# Patient Record
Sex: Female | Born: 1955
Health system: Southern US, Community
[De-identification: ages and names within clinical notes are randomized; demographics above are authoritative.]

## PROBLEM LIST (undated history)

## (undated) DIAGNOSIS — E119 Type 2 diabetes mellitus without complications: Secondary | ICD-10-CM

## (undated) DIAGNOSIS — E785 Hyperlipidemia, unspecified: Secondary | ICD-10-CM

## (undated) DIAGNOSIS — I10 Essential (primary) hypertension: Secondary | ICD-10-CM

## (undated) DIAGNOSIS — E079 Disorder of thyroid, unspecified: Secondary | ICD-10-CM

## (undated) HISTORY — PX: TUBAL LIGATION: SHX77

---

## 1998-06-11 ENCOUNTER — Emergency Department (HOSPITAL_COMMUNITY): Admission: EM | Admit: 1998-06-11 | Discharge: 1998-06-12 | Payer: Self-pay | Admitting: Emergency Medicine

## 1998-06-11 ENCOUNTER — Encounter: Payer: Self-pay | Admitting: Emergency Medicine

## 1998-06-12 ENCOUNTER — Encounter: Payer: Self-pay | Admitting: Emergency Medicine

## 1998-06-18 ENCOUNTER — Encounter: Admission: RE | Admit: 1998-06-18 | Discharge: 1998-07-05 | Payer: Self-pay | Admitting: Family Medicine

## 1998-08-02 ENCOUNTER — Other Ambulatory Visit: Admission: RE | Admit: 1998-08-02 | Discharge: 1998-08-02 | Payer: Self-pay | Admitting: Gynecology

## 2000-11-24 ENCOUNTER — Other Ambulatory Visit: Admission: RE | Admit: 2000-11-24 | Discharge: 2000-11-24 | Payer: Self-pay | Admitting: Obstetrics and Gynecology

## 2001-11-25 ENCOUNTER — Other Ambulatory Visit: Admission: RE | Admit: 2001-11-25 | Discharge: 2001-11-25 | Payer: Self-pay | Admitting: Obstetrics and Gynecology

## 2003-01-12 ENCOUNTER — Other Ambulatory Visit: Admission: RE | Admit: 2003-01-12 | Discharge: 2003-01-12 | Payer: Self-pay | Admitting: Obstetrics and Gynecology

## 2003-02-24 ENCOUNTER — Emergency Department (HOSPITAL_COMMUNITY): Admission: EM | Admit: 2003-02-24 | Discharge: 2003-02-24 | Payer: Self-pay | Admitting: Emergency Medicine

## 2005-01-17 ENCOUNTER — Other Ambulatory Visit: Admission: RE | Admit: 2005-01-17 | Discharge: 2005-01-17 | Payer: Self-pay | Admitting: *Deleted

## 2005-06-11 ENCOUNTER — Ambulatory Visit (HOSPITAL_COMMUNITY): Admission: RE | Admit: 2005-06-11 | Discharge: 2005-06-11 | Payer: Self-pay | Admitting: Family Medicine

## 2005-06-12 ENCOUNTER — Encounter: Admission: RE | Admit: 2005-06-12 | Discharge: 2005-07-08 | Payer: Self-pay | Admitting: Family Medicine

## 2006-07-08 ENCOUNTER — Other Ambulatory Visit: Admission: RE | Admit: 2006-07-08 | Discharge: 2006-07-08 | Payer: Self-pay | Admitting: Obstetrics and Gynecology

## 2007-06-15 ENCOUNTER — Ambulatory Visit (HOSPITAL_COMMUNITY): Admission: RE | Admit: 2007-06-15 | Discharge: 2007-06-15 | Payer: Self-pay | Admitting: Obstetrics and Gynecology

## 2007-08-30 ENCOUNTER — Encounter: Payer: Self-pay | Admitting: Obstetrics and Gynecology

## 2007-08-30 ENCOUNTER — Ambulatory Visit (HOSPITAL_BASED_OUTPATIENT_CLINIC_OR_DEPARTMENT_OTHER): Admission: RE | Admit: 2007-08-30 | Discharge: 2007-08-30 | Payer: Self-pay | Admitting: Obstetrics and Gynecology

## 2008-01-05 ENCOUNTER — Ambulatory Visit (HOSPITAL_COMMUNITY): Admission: RE | Admit: 2008-01-05 | Discharge: 2008-01-05 | Payer: Self-pay | Admitting: Family Medicine

## 2008-05-16 ENCOUNTER — Encounter: Admission: RE | Admit: 2008-05-16 | Discharge: 2008-05-16 | Payer: Self-pay | Admitting: Endocrinology

## 2008-08-23 ENCOUNTER — Encounter: Payer: Self-pay | Admitting: Internal Medicine

## 2008-08-23 ENCOUNTER — Encounter: Admission: RE | Admit: 2008-08-23 | Discharge: 2008-08-23 | Payer: Self-pay | Admitting: Family Medicine

## 2008-08-30 ENCOUNTER — Emergency Department (HOSPITAL_COMMUNITY): Admission: EM | Admit: 2008-08-30 | Discharge: 2008-08-31 | Payer: Self-pay | Admitting: Emergency Medicine

## 2008-10-04 ENCOUNTER — Other Ambulatory Visit: Admission: RE | Admit: 2008-10-04 | Discharge: 2008-10-04 | Payer: Self-pay | Admitting: Obstetrics and Gynecology

## 2008-10-05 ENCOUNTER — Ambulatory Visit: Payer: Self-pay | Admitting: Internal Medicine

## 2008-10-05 DIAGNOSIS — I10 Essential (primary) hypertension: Secondary | ICD-10-CM | POA: Insufficient documentation

## 2008-10-05 DIAGNOSIS — R0989 Other specified symptoms and signs involving the circulatory and respiratory systems: Secondary | ICD-10-CM

## 2008-10-05 DIAGNOSIS — R0609 Other forms of dyspnea: Secondary | ICD-10-CM | POA: Insufficient documentation

## 2008-10-05 DIAGNOSIS — E785 Hyperlipidemia, unspecified: Secondary | ICD-10-CM | POA: Insufficient documentation

## 2008-10-05 DIAGNOSIS — E119 Type 2 diabetes mellitus without complications: Secondary | ICD-10-CM | POA: Insufficient documentation

## 2008-11-20 ENCOUNTER — Encounter: Admission: RE | Admit: 2008-11-20 | Discharge: 2008-11-20 | Payer: Self-pay | Admitting: Endocrinology

## 2009-02-05 ENCOUNTER — Encounter: Admission: RE | Admit: 2009-02-05 | Discharge: 2009-02-05 | Payer: Self-pay | Admitting: Obstetrics and Gynecology

## 2009-02-13 ENCOUNTER — Encounter: Admission: RE | Admit: 2009-02-13 | Discharge: 2009-02-13 | Payer: Self-pay | Admitting: Obstetrics and Gynecology

## 2010-03-17 ENCOUNTER — Encounter: Payer: Self-pay | Admitting: Obstetrics and Gynecology

## 2010-03-18 ENCOUNTER — Encounter: Payer: Self-pay | Admitting: Obstetrics and Gynecology

## 2010-06-02 LAB — DIFFERENTIAL
Basophils Absolute: 0 10*3/uL (ref 0.0–0.1)
Eosinophils Absolute: 0 10*3/uL (ref 0.0–0.7)
Eosinophils Relative: 0 % (ref 0–5)
Lymphocytes Relative: 27 % (ref 12–46)
Monocytes Absolute: 0.2 10*3/uL (ref 0.1–1.0)

## 2010-06-02 LAB — CBC
HCT: 39.2 % (ref 36.0–46.0)
Hemoglobin: 13.2 g/dL (ref 12.0–15.0)
MCHC: 33.7 g/dL (ref 30.0–36.0)
MCV: 91.2 fL (ref 78.0–100.0)
Platelets: 339 10*3/uL (ref 150–400)
RDW: 13.2 % (ref 11.5–15.5)

## 2010-06-02 LAB — POCT I-STAT, CHEM 8
BUN: 17 mg/dL (ref 6–23)
Calcium, Ion: 1.2 mmol/L (ref 1.12–1.32)
Hemoglobin: 13.9 g/dL (ref 12.0–15.0)
Sodium: 142 mEq/L (ref 135–145)
TCO2: 29 mmol/L (ref 0–100)

## 2010-06-02 LAB — POCT CARDIAC MARKERS

## 2010-06-02 LAB — D-DIMER, QUANTITATIVE: D-Dimer, Quant: <0.22 ug/mL-FEU (ref 0.00–0.48)

## 2010-07-09 NOTE — Op Note (Signed)
[name], [name]                 ACCOUNT NO.:  [id number]   MEDICAL RECORD NO.:  [id number]          PATIENT TYPE:  AMB   LOCATION:  [facility]                         FACILITY:  [facility]   PHYSICIAN:  Cynthia P. Romine, M.D.DATE OF BIRTH:  January 09, 1956   DATE OF PROCEDURE:  08/30/2007  DATE OF DISCHARGE:                               OPERATIVE REPORT   PREOPERATIVE DIAGNOSIS:  Menorrhagia, with 4 cm prolapsed fibroid.   POSTOPERATIVE DIAGNOSIS:  Menorrhagia, with 4 cm prolapsed fibroid,  pathology pending.   PROCEDURE:  Hysteroscopic resection of fibroid, D&C.   SURGEON:  Cynthia P. Romine, M.D.   ANESTHESIA:  General by LMA.   ESTIMATED BLOOD LOSS:  50 mL.   COMPLICATIONS:  None.   SORBITOL DEFICIT:  145 mL.   PROCEDURE:  The patient was taken to the operating room and, after the  induction of adequate general anesthesia, was placed in the dorsal  lithotomy position and prepped and draped in the usual fashion.  The  bladder was drained with a red rubber catheter.  Posterior weighted and  anterior Sims retractor were placed.  Anterior lip of the cervix was  grasped with a single-tooth tenaculum.  A 4 cm fibroid could be seen  prolapsing through the cervix.  It was not possible to feel where the  attachment of the fibroid was to the uterus, but the cervix was very  dilated.  The fibroid was grasped with a tenaculum, and as far up into  the endocervical canal as was possible, the hysteroscope was placed, and  a single loop was used to remove the fibroid from its stalk.  Once the  fibroid was removed, the hysteroscope was inserted into the uterus, and  the fundus and the tubal ostia were noted.  There was also noted to be  about a 2 cm submucous myoma emanating from the patient's left fundus.  The rest of the endometrium appeared normal.  The scope was removed.  Sharp curettage was done and the specimen sent to pathology.  The scope  was then reinserted, and the 2 cm submucous myoma  was shaved with a  single loop cautery.  The fluid deficit start rising more rapidly than I  was comfortable with.  It had been at 140 mL and went up to 210 mL  within just a couple of minutes, and the myoma was bleeding rather  briskly, so was felt that it would be wise of [place] to stop the procedure  as soon as was safe.  The bleeding points on the fibroid that was  approximately half removed were cauterized, and with control the  bleeding the deficit stabilized, but because the surgeon felt like it  was possible that there was an area around the base of this fibroid that  looked like the myometrium was thin, I was concerned about perforation  if I continued, and because it was known that the patient had another  large subserous myoma and has already decided to have a hysterectomy  later, it was decided not to proceed with the hysteroscopic resection of  this second fibroid.  So, once bleeding was controlled, the scope was  removed.  The deficit ended up being about 145 mL.  There was some  sorbitol in the drape that had been not counted when the deficit had  been up to 210.  The patient was observed, and  the cervix was watched for bleeding for about 5 minutes after I took the  scope out, and the amount of bleeding was very small.  She was felt to  be stable.  Instruments were removed from the vagina, and the procedure  was terminated.  The patient tolerated the procedure well and went in  satisfactory condition to postanesthesia recovery.      Cynthia P. Romine, M.D.  Electronically Signed     CPR/MEDQ  D:  08/30/2007  T:  08/30/2007  Job:  [id number]

## 2010-11-21 LAB — POCT I-STAT 4, (NA,K, GLUC, HGB,HCT)
Glucose, Bld: 102 — ABNORMAL HIGH
HCT: 44
Operator id: 268271
Potassium: 3.4 — ABNORMAL LOW

## 2010-11-26 LAB — COMPREHENSIVE METABOLIC PANEL
ALT: 23
AST: 17
Albumin: 4.3
Calcium: 9.5
Creatinine, Ser: 0.63
GFR calc Af Amer: >60
Sodium: 139
Total Protein: 7

## 2010-11-26 LAB — LIPID PANEL
HDL: 83
Total CHOL/HDL Ratio: 3.4
Triglycerides: 96
VLDL: 19

## 2010-11-26 LAB — CBC
MCHC: 33.3
MCV: 92.3
Platelets: 318

## 2010-11-26 LAB — TSH: TSH: 0.607

## 2012-02-24 ENCOUNTER — Other Ambulatory Visit: Payer: Self-pay | Admitting: Obstetrics and Gynecology

## 2012-02-24 DIAGNOSIS — N63 Unspecified lump in unspecified breast: Secondary | ICD-10-CM

## 2012-02-26 ENCOUNTER — Other Ambulatory Visit: Payer: Self-pay | Admitting: Family Medicine

## 2012-02-26 DIAGNOSIS — N63 Unspecified lump in unspecified breast: Secondary | ICD-10-CM

## 2012-03-03 ENCOUNTER — Telehealth (HOSPITAL_COMMUNITY): Payer: Self-pay | Admitting: *Deleted

## 2012-03-03 NOTE — Telephone Encounter (Signed)
Telephoned home # and unable to leave message. Telephoned mobile # and voice mailbox not set up. Unable to schedule [facility] appointment.

## 2012-03-08 ENCOUNTER — Other Ambulatory Visit: Payer: Self-pay

## 2012-03-11 ENCOUNTER — Encounter (HOSPITAL_COMMUNITY): Payer: Self-pay | Admitting: *Deleted

## 2012-03-16 ENCOUNTER — Other Ambulatory Visit: Payer: Self-pay

## 2012-03-26 ENCOUNTER — Ambulatory Visit (HOSPITAL_COMMUNITY)
Admission: RE | Admit: 2012-03-26 | Discharge: 2012-03-26 | Disposition: A | Discharge status: Home / Self Care | Payer: Self-pay | Source: Ambulatory Visit | Attending: Obstetrics and Gynecology | Admitting: Obstetrics and Gynecology

## 2012-03-26 ENCOUNTER — Encounter (HOSPITAL_COMMUNITY): Payer: Self-pay

## 2012-03-26 VITALS — BP 136/82 | Temp 98.3°F | Ht 63.5 in | Wt 189.2 lb

## 2012-03-26 DIAGNOSIS — Z01419 Encounter for gynecological examination (general) (routine) without abnormal findings: Secondary | ICD-10-CM

## 2012-03-26 DIAGNOSIS — N644 Mastodynia: Secondary | ICD-10-CM | POA: Insufficient documentation

## 2012-03-26 HISTORY — DX: Disorder of thyroid, unspecified: E07.9

## 2012-03-26 HISTORY — DX: Essential (primary) hypertension: I10

## 2012-03-26 HISTORY — DX: Hyperlipidemia, unspecified: E78.5

## 2012-03-26 HISTORY — DX: Type 2 diabetes mellitus without complications: E11.9

## 2012-03-26 NOTE — Progress Notes (Signed)
Complaints of right lower and outer breast pain that comes and goes. Patient rates pain at a 4 out of 10 stating it increases at night.  Pap Smear:    Completed Pap smear today. Last Pap smear was 10/04/2008 and normal. Per patient no history of abnormal Pap smears. Pap smear result in EPIC.  Physical exam: Breasts Breasts symmetrical. No skin abnormalities bilateral breasts. No nipple retraction bilateral breasts. No nipple discharge bilateral breasts. No lymphadenopathy. No lumps palpated bilateral breasts. Patient complained of tenderness when palpated right lower breast. Discussed with patient possible causes of breast pain. Referred patient to the [facility] for diagnostic mammogram and possible right breast ultrasound. Appointment scheduled for Monday, March 29, 2012 at 1440.           Pelvic/Bimanual   Ext Genitalia No lesions, no swelling and no discharge observed on external genitalia.         Vagina Vagina pink and normal texture. No lesions or discharge observed in vagina.          Cervix Cervix is present. Cervix pink and of normal texture. Cervical polyp observed at cervical os and bled easy on exam.         Uterus Uterus is present and palpable. Uterus in normal position and normal size.       Adnexae Bilateral ovaries present and palpable. No tenderness on palpation.        Rectovaginal No rectal exam completed today since patient had no rectal complaints. No skin abnormalities observed on exam.

## 2012-03-26 NOTE — Patient Instructions (Addendum)
Taught patient how to perform BSE. Let her know [facility] will cover Pap smears every 3 years unless has a history of abnormal Pap smears. Recommended patient to have cervical polyp removed gave her resources of where she can have it removed. Referred patient to the [facility] for diagnostic mammogram and possible right breast ultrasound. Appointment scheduled for Monday, March 29, 2012 at 1440. Patient aware of appointment and will be there. Let patient know will follow up with her within the next couple weeks with results by letter or phone. Patient verbalized understanding.

## 2012-03-29 ENCOUNTER — Ambulatory Visit
Admission: RE | Admit: 2012-03-29 | Discharge: 2012-03-29 | Disposition: A | Discharge status: Home / Self Care | Payer: No Typology Code available for payment source | Source: Ambulatory Visit | Attending: Family Medicine | Admitting: Family Medicine

## 2012-03-29 ENCOUNTER — Other Ambulatory Visit (HOSPITAL_COMMUNITY)
Admission: RE | Admit: 2012-03-29 | Discharge: 2012-03-29 | Disposition: A | Discharge status: Home / Self Care | Payer: Self-pay | Source: Ambulatory Visit | Attending: Obstetrics and Gynecology | Admitting: Obstetrics and Gynecology

## 2012-03-29 DIAGNOSIS — N63 Unspecified lump in unspecified breast: Secondary | ICD-10-CM

## 2012-03-29 DIAGNOSIS — N841 Polyp of cervix uteri: Secondary | ICD-10-CM | POA: Insufficient documentation

## 2013-06-21 ENCOUNTER — Other Ambulatory Visit: Payer: Self-pay | Admitting: Family Medicine

## 2013-06-21 DIAGNOSIS — E049 Nontoxic goiter, unspecified: Secondary | ICD-10-CM

## 2013-06-23 ENCOUNTER — Ambulatory Visit
Admission: RE | Admit: 2013-06-23 | Discharge: 2013-06-23 | Disposition: A | Discharge status: Home / Self Care | Payer: BC Managed Care – PPO | Source: Ambulatory Visit | Attending: Family Medicine | Admitting: Family Medicine

## 2013-06-23 DIAGNOSIS — E049 Nontoxic goiter, unspecified: Secondary | ICD-10-CM

## 2014-03-27 ENCOUNTER — Other Ambulatory Visit: Payer: Self-pay | Admitting: Family Medicine

## 2014-03-27 DIAGNOSIS — E039 Hypothyroidism, unspecified: Secondary | ICD-10-CM

## 2014-04-04 ENCOUNTER — Ambulatory Visit
Admission: RE | Admit: 2014-04-04 | Discharge: 2014-04-04 | Disposition: A | Discharge status: Home / Self Care | Payer: BLUE CROSS/BLUE SHIELD | Source: Ambulatory Visit | Attending: Family Medicine | Admitting: Family Medicine

## 2014-04-04 DIAGNOSIS — E039 Hypothyroidism, unspecified: Secondary | ICD-10-CM

## 2017-01-01 DIAGNOSIS — Z23 Encounter for immunization: Secondary | ICD-10-CM | POA: Diagnosis not present

## 2017-01-08 DIAGNOSIS — E7800 Pure hypercholesterolemia, unspecified: Secondary | ICD-10-CM | POA: Diagnosis not present

## 2017-01-08 DIAGNOSIS — E1165 Type 2 diabetes mellitus with hyperglycemia: Secondary | ICD-10-CM | POA: Diagnosis not present

## 2017-01-08 DIAGNOSIS — E119 Type 2 diabetes mellitus without complications: Secondary | ICD-10-CM | POA: Diagnosis not present

## 2017-01-08 DIAGNOSIS — I119 Hypertensive heart disease without heart failure: Secondary | ICD-10-CM | POA: Diagnosis not present

## 2017-06-11 DIAGNOSIS — E039 Hypothyroidism, unspecified: Secondary | ICD-10-CM | POA: Diagnosis not present

## 2017-06-11 DIAGNOSIS — I119 Hypertensive heart disease without heart failure: Secondary | ICD-10-CM | POA: Diagnosis not present

## 2017-06-11 DIAGNOSIS — D509 Iron deficiency anemia, unspecified: Secondary | ICD-10-CM | POA: Diagnosis not present

## 2017-06-11 DIAGNOSIS — E7800 Pure hypercholesterolemia, unspecified: Secondary | ICD-10-CM | POA: Diagnosis not present

## 2017-06-11 DIAGNOSIS — E1165 Type 2 diabetes mellitus with hyperglycemia: Secondary | ICD-10-CM | POA: Diagnosis not present

## 2017-06-11 DIAGNOSIS — Z79899 Other long term (current) drug therapy: Secondary | ICD-10-CM | POA: Diagnosis not present

## 2017-10-02 DIAGNOSIS — H25013 Cortical age-related cataract, bilateral: Secondary | ICD-10-CM | POA: Diagnosis not present

## 2017-10-02 DIAGNOSIS — H40013 Open angle with borderline findings, low risk, bilateral: Secondary | ICD-10-CM | POA: Diagnosis not present

## 2017-10-02 DIAGNOSIS — H2513 Age-related nuclear cataract, bilateral: Secondary | ICD-10-CM | POA: Diagnosis not present

## 2017-10-12 NOTE — Progress Notes (Signed)
[facility] Note  10/13/2017     CHIEF COMPLAINT Patient presents for Retina Evaluation and Diabetic Eye Exam   HISTORY OF PRESENT ILLNESS: [name] is a 62 y.o. female who presents to the clinic today for:   HPI    Retina Evaluation    In both eyes.  This started 3 weeks ago.  Associated Symptoms Negative for Flashes, Blind Spot, Scalp Tenderness, Photophobia, Floaters, Pain, Glare, Jaw Claudication, Weight Loss, Distortion, Redness, Trauma, Shoulder/Hip pain, Fever and Fatigue.  Context:  distance vision, mid-range vision and near vision.  Treatments tried include eye drops and surgery.  Response to treatment was mild improvement.  I, the attending physician,  performed the HPI with the patient and updated documentation appropriately.          Diabetic Eye Exam    Vision is stable.  Associated Symptoms Negative for Flashes, Blind Spot, Photophobia, Scalp Tenderness, Fever, Floaters, Pain, Glare, Jaw Claudication, Weight Loss, Distortion, Redness, Trauma and Shoulder/Hip pain.  Diabetes characteristics include Type 2.  This started 10 years ago.  Blood sugar level is controlled.  Last Blood Glucose 110.  I, the attending physician,  performed the HPI with the patient and updated documentation appropriately.          Comments    Referral of Dr. Shirley Muscat for retina eval. Patient states she has occasional ache OS since  disinfect splashed in her eye three weeks ago. Denies flashes, floaters and blurry vision.Pt had laser OU 2018 with visual improvement . Pt use Refresh Opti gtt's and Ketofen gtt's. PRN. Pt is DM2 x 10 yrs Bs are usually WINL per patient, Bs 110 (10/12/17) A1C (results unknown) reports WINL .Patient is on Glipizide.        Last edited by Bernarda Caffey, MD on 10/13/2017  2:50 PM. (History)      Referring physician: Calton Dach, MD [id number], [place] [address]  HISTORICAL INFORMATION:   Selected notes  from the MEDICAL RECORD NUMBER Referred by Dr. Thurston Hole for retinal evaluation LEE: 08.12.19 (S. Bernstorf) [BCVA: OD: 20/25-- OS: 20/30--] Ocular Hx-open angle glaucoma OU, cataract OU, blepharitis OU -- upper / lower -- arcus, conjunctivochalasis, pinguecula PMH-DM (taking metformin), HTN, high cholesterol    CURRENT MEDICATIONS: No current outpatient medications on file. (Ophthalmic Drugs)   No current facility-administered medications for this visit.  (Ophthalmic Drugs)   Current Outpatient Medications (Other)  Medication Sig  . furosemide (LASIX) 20 MG tablet Take 20 mg by mouth 2 (two) times daily.  Marland Kitchen glipiZIDE (GLUCOTROL) 5 MG tablet Take 5 mg by mouth 2 (two) times daily before a meal.  . levothyroxine (SYNTHROID, LEVOTHROID) 50 MCG tablet Take 50 mcg by mouth daily.  Marland Kitchen lisinopril (PRINIVIL,ZESTRIL) 20 MG tablet Take 20 mg by mouth daily.  . pravastatin (PRAVACHOL) 10 MG tablet Take 10 mg by mouth daily.   No current facility-administered medications for this visit.  (Other)      REVIEW OF SYSTEMS: ROS    Positive for: Endocrine, Eyes   Negative for: Constitutional, Gastrointestinal, Neurological, Skin, Genitourinary, Musculoskeletal, HENT, Cardiovascular, Respiratory, Psychiatric, Allergic/Imm, Heme/Lymph   Last edited by Zenovia Jordan, LPN on 3/41/9622  2:97 PM. (History)       ALLERGIES No Known Allergies  PAST MEDICAL HISTORY Past Medical History:  Diagnosis Date  . Diabetes mellitus without complication ([place])   . Hyperlipidemia   . Hypertension   . Thyroid disease    Past Surgical  History:  Procedure Laterality Date  . TUBAL LIGATION      FAMILY HISTORY Family History  Problem Relation Age of Onset  . Diabetes Mother   . Hypertension Mother   . Diabetes Father   . Hypertension Father   . Diabetes Sister   . Diabetes Brother     SOCIAL HISTORY Social History   Tobacco Use  . Smoking status: Never Smoker  . Smokeless tobacco: Never  Used  Substance Use Topics  . Alcohol use: No  . Drug use: No         OPHTHALMIC EXAM:  Base Eye Exam    Visual Acuity (Snellen - Linear)      Right Left   Dist cc 20/20 20/25   Dist ph cc  NI   Correction:  Glasses       Tonometry (Tonopen, 1:37 PM)      Right Left   Pressure 14 17       Pupils      Dark Light Shape React APD   Right 4 3 Round Brisk None   Left 4 3 Round Brisk None       Visual Fields (Counting fingers)      Left Right    Full Full       Extraocular Movement      Right Left    Full, Ortho Full, Ortho       Neuro/Psych    Oriented x3:  Yes   Mood/Affect:  Normal       Dilation    Both eyes:  1.0% Mydriacyl, 2.5% Phenylephrine @ 1:37 PM        Slit Lamp and Fundus Exam    Slit Lamp Exam      Right Left   Lids/Lashes Meibomian gland dysfunction Meibomian gland dysfunction   Conjunctiva/Sclera Mild Melanosis, Trace Injection Mild Melanosis, Trace Injection, Nasal Pinguecula   Cornea Arcus, Debris in tear film, trace Punctate epithelial erosions Arcus, Debris in tear film, trace Punctate epithelial erosions, Decreased TBUT   Anterior Chamber Deep, Narrow angle Deep, Narrow angle   Iris Round and dilated, Patent peripheral iridectomy at 0830 Round and dilated, Patent peripheral iridectomy at 0330   Lens 2+ Nuclear sclerosis, 2+ Cortical cataract 2+ Nuclear sclerosis, 2+ Cortical cataract   Vitreous Vitreous syneresis Vitreous syneresis       Fundus Exam      Right Left   Disc Temporal Peripapillary atrophy, Pink and Sharp, Tilted disc Pink and Sharp   C/D Ratio 0.5 0.5   Macula Good foveal reflex, Retinal pigment epithelial mottling, No heme or edema Good foveal reflex, RPE irregularity temporal fovea, trace cystic changes, Retinal pigment epithelial mottling, No heme or edema   Vessels Vascular attenuation Vascular attenuation   Periphery Attached Attached        Refraction    Manifest Refraction (Auto)      Sphere Cylinder Axis  Dist VA   Right +0.25 +0.50 155 20/20   Left +0.00 +0.50 028 20/20-2          IMAGING AND PROCEDURES  Imaging and Procedures for _0 @  OCT, Retina - OU - Both Eyes       Right Eye Quality was good. Central Foveal Thickness: 244. Progression has no prior data. Findings include normal foveal contour, no SRF, intraretinal fluid.   Left Eye Quality was good. Central Foveal Thickness: 252. Progression has no prior data. Findings include normal foveal contour, no SRF, intraretinal fluid, outer retinal atrophy.  Notes *Images captured and stored on drive  Diagnosis / Impression:  OD: mild cystic changes temporal fovea OS: mild cystic changes temporal fovea with mild ellipsoid disruption underlying cystic changes  Clinical management:  See below  Abbreviations: NFP - Normal foveal profile. CME - cystoid macular edema. PED - pigment epithelial detachment. IRF - intraretinal fluid. SRF - subretinal fluid. EZ - ellipsoid zone. ERM - epiretinal membrane. ORA - outer retinal atrophy. ORT - outer retinal tubulation. SRHM - subretinal hyper-reflective material         Fluorescein Angiography Optos (Transit OS)       Right Eye   Progression has no prior data. Early phase findings include normal observations (Low signal). Mid/Late phase findings include normal observations (Low signal).   Left Eye   Progression has no prior data. Early phase findings include normal observations (Low signal). Mid/Late phase findings include normal observations (Low signal).   Notes *Images captured and stored on drive;   Impression: normal study OU -- no leakage / CME corresponding to cystic changes on OCT; no MA DM2 without retinopathy                  ASSESSMENT/PLAN:    ICD-10-CM   1. Retinal edema H35.81 OCT, Retina - OU - Both Eyes  2. Diabetes mellitus type 2 without retinopathy ([place]) E11.9 Fluorescein Angiography Optos (Transit OS)  3. Essential hypertension I10   4.  Hypertensive retinopathy of both eyes H35.033 Fluorescein Angiography Optos (Transit OS)  5. Combined forms of age-related cataract of both eyes H25.813   6. Anatomical narrow angle H40.039     1. Retinal Edema OU, OS>OD - Mild cystic changes, temporal fovea OU (OS > OD) - FA today 8.20.19 with low signal but no obvious leakage or CME corresponding to cystic changes - BCVA 20/20 OU and patient asymptomatic -- minimal visual significance - differential includes mild foveoschisis, mac tel 2, DME but FA suggests non-vascular etiology - F/U 4 months  2. Diabetes mellitus, type 2 without retinopathy - The incidence, risk factors for progression, natural history and treatment options for diabetic retinopathy  were discussed with patient.   - The need for close monitoring of blood glucose, blood pressure, and serum lipids, avoiding cigarette or any type of tobacco, and the need for long term follow up was also discussed with patient. - f/u in 1 year, sooner prn  3,4. Hypertensive retinopathy OU - discussed importance of tight BP control - monitor  5. Combined form age related cataract OU-  - The symptoms of cataract, surgical options, and treatments and risks were discussed with patient. - discussed diagnosis and progression - not yet visually significant - monitor for now  6. Narrow angles OU-  - S/P PI OU by Dr. Katy Fitch (2018) - IOP good today - not on any gtts - monitor   Ophthalmic Meds Ordered this visit:  No orders of the defined types were placed in this encounter.      Return in about 4 months (around 02/12/2018) for F/U cystic changes OU, DFE, OCT.  There are no Patient Instructions on file for this visit.   Explained the diagnoses, plan, and follow up with the patient and they expressed understanding.  Patient expressed understanding of the importance of proper follow up care.  This document serves as a record of services personally performed by Gardiner Sleeper, MD,  PhD. It was created on their behalf by Ernest Mallick, OA, an ophthalmic assistant. The creation of this  record is the provider's dictation and/or activities during the visit.    Electronically signed by: Ernest Mallick, OA  08.19.2019 3:04 PM     Gardiner Sleeper, M.D., Ph.D. Diseases & Surgery of the Retina and Vitreous [facility] [place] [facility]   I have reviewed the above documentation for accuracy and completeness, and I agree with the above. Gardiner Sleeper, M.D., Ph.D. 10/13/17 3:04 PM     Abbreviations: M myopia (nearsighted); A astigmatism; H hyperopia (farsighted); P presbyopia; Mrx spectacle prescription;  CTL contact lenses; OD right eye; OS left eye; OU both eyes  XT exotropia; ET esotropia; PEK punctate epithelial keratitis; PEE punctate epithelial erosions; DES dry eye syndrome; MGD meibomian gland dysfunction; ATs artificial tears; PFAT's preservative free artificial tears; [place] nuclear sclerotic cataract; PSC posterior subcapsular cataract; ERM epi-retinal membrane; PVD posterior vitreous detachment; RD retinal detachment; DM diabetes mellitus; DR diabetic retinopathy; NPDR non-proliferative diabetic retinopathy; PDR proliferative diabetic retinopathy; CSME clinically significant macular edema; DME diabetic macular edema; dbh dot blot hemorrhages; CWS cotton wool spot; POAG primary open angle glaucoma; C/D cup-to-disc ratio; HVF humphrey visual field; GVF goldmann visual field; OCT optical coherence tomography; IOP intraocular pressure; BRVO Branch retinal vein occlusion; CRVO central retinal vein occlusion; CRAO central retinal artery occlusion; BRAO branch retinal artery occlusion; RT retinal tear; SB scleral buckle; PPV pars plana vitrectomy; VH Vitreous hemorrhage; PRP panretinal laser photocoagulation; IVK intravitreal kenalog; VMT vitreomacular traction; MH Macular hole;  NVD neovascularization of the disc; NVE neovascularization elsewhere; AREDS age related eye disease  study; ARMD age related macular degeneration; POAG primary open angle glaucoma; EBMD epithelial/anterior basement membrane dystrophy; ACIOL anterior chamber intraocular lens; IOL intraocular lens; PCIOL posterior chamber intraocular lens; Phaco/IOL phacoemulsification with intraocular lens placement; [place] photorefractive keratectomy; LASIK laser assisted in situ keratomileusis; HTN hypertension; DM diabetes mellitus; COPD chronic obstructive pulmonary disease

## 2017-10-13 ENCOUNTER — Encounter (INDEPENDENT_AMBULATORY_CARE_PROVIDER_SITE_OTHER): Payer: Self-pay | Admitting: Ophthalmology

## 2017-10-13 ENCOUNTER — Ambulatory Visit (INDEPENDENT_AMBULATORY_CARE_PROVIDER_SITE_OTHER): Payer: 59 | Admitting: Ophthalmology

## 2017-10-13 DIAGNOSIS — H35033 Hypertensive retinopathy, bilateral: Secondary | ICD-10-CM | POA: Diagnosis not present

## 2017-10-13 DIAGNOSIS — E119 Type 2 diabetes mellitus without complications: Secondary | ICD-10-CM

## 2017-10-13 DIAGNOSIS — H25813 Combined forms of age-related cataract, bilateral: Secondary | ICD-10-CM

## 2017-10-13 DIAGNOSIS — H3581 Retinal edema: Secondary | ICD-10-CM

## 2017-10-13 DIAGNOSIS — H40039 Anatomical narrow angle, unspecified eye: Secondary | ICD-10-CM

## 2017-10-13 DIAGNOSIS — I10 Essential (primary) hypertension: Secondary | ICD-10-CM

## 2017-11-04 ENCOUNTER — Other Ambulatory Visit: Payer: Self-pay | Admitting: Family Medicine

## 2017-11-04 DIAGNOSIS — Z1231 Encounter for screening mammogram for malignant neoplasm of breast: Secondary | ICD-10-CM

## 2017-11-05 DIAGNOSIS — Z23 Encounter for immunization: Secondary | ICD-10-CM | POA: Diagnosis not present

## 2018-02-10 ENCOUNTER — Encounter (INDEPENDENT_AMBULATORY_CARE_PROVIDER_SITE_OTHER): Payer: 59 | Admitting: Ophthalmology

## 2018-02-22 DIAGNOSIS — Z23 Encounter for immunization: Secondary | ICD-10-CM | POA: Diagnosis not present

## 2018-05-11 ENCOUNTER — Other Ambulatory Visit: Payer: Self-pay | Admitting: Pulmonary Disease

## 2018-05-11 DIAGNOSIS — E1165 Type 2 diabetes mellitus with hyperglycemia: Secondary | ICD-10-CM | POA: Diagnosis not present

## 2018-05-11 DIAGNOSIS — Z0001 Encounter for general adult medical examination with abnormal findings: Secondary | ICD-10-CM | POA: Diagnosis not present

## 2018-05-11 DIAGNOSIS — E039 Hypothyroidism, unspecified: Secondary | ICD-10-CM | POA: Diagnosis not present

## 2018-05-11 DIAGNOSIS — E7800 Pure hypercholesterolemia, unspecified: Secondary | ICD-10-CM | POA: Diagnosis not present

## 2018-05-11 DIAGNOSIS — Z1231 Encounter for screening mammogram for malignant neoplasm of breast: Secondary | ICD-10-CM

## 2018-05-11 DIAGNOSIS — E119 Type 2 diabetes mellitus without complications: Secondary | ICD-10-CM | POA: Diagnosis not present

## 2018-05-13 DIAGNOSIS — E7800 Pure hypercholesterolemia, unspecified: Secondary | ICD-10-CM | POA: Diagnosis not present

## 2018-05-13 DIAGNOSIS — E1165 Type 2 diabetes mellitus with hyperglycemia: Secondary | ICD-10-CM | POA: Diagnosis not present

## 2018-05-13 DIAGNOSIS — E039 Hypothyroidism, unspecified: Secondary | ICD-10-CM | POA: Diagnosis not present

## 2018-05-13 DIAGNOSIS — Z0001 Encounter for general adult medical examination with abnormal findings: Secondary | ICD-10-CM | POA: Diagnosis not present

## 2018-06-14 ENCOUNTER — Ambulatory Visit: Payer: BLUE CROSS/BLUE SHIELD

## 2018-07-30 ENCOUNTER — Ambulatory Visit
Admission: RE | Admit: 2018-07-30 | Discharge: 2018-07-30 | Disposition: A | Discharge status: Home / Self Care | Payer: 59 | Source: Ambulatory Visit | Attending: Pulmonary Disease | Admitting: Pulmonary Disease

## 2018-07-30 ENCOUNTER — Other Ambulatory Visit: Payer: Self-pay

## 2018-07-30 DIAGNOSIS — Z1231 Encounter for screening mammogram for malignant neoplasm of breast: Secondary | ICD-10-CM

## 2019-01-12 DIAGNOSIS — Z86010 Personal history of colonic polyps: Secondary | ICD-10-CM | POA: Diagnosis not present

## 2019-09-28 IMAGING — MG DIGITAL SCREENING BILATERAL MAMMOGRAM WITH CAD
4 series · 4 of 4 positions shown · non-contrast
Comparison: Previous exam(s).

CLINICAL DATA: Screening.

EXAM:
DIGITAL SCREENING BILATERAL MAMMOGRAM WITH CAD

[L MLO]
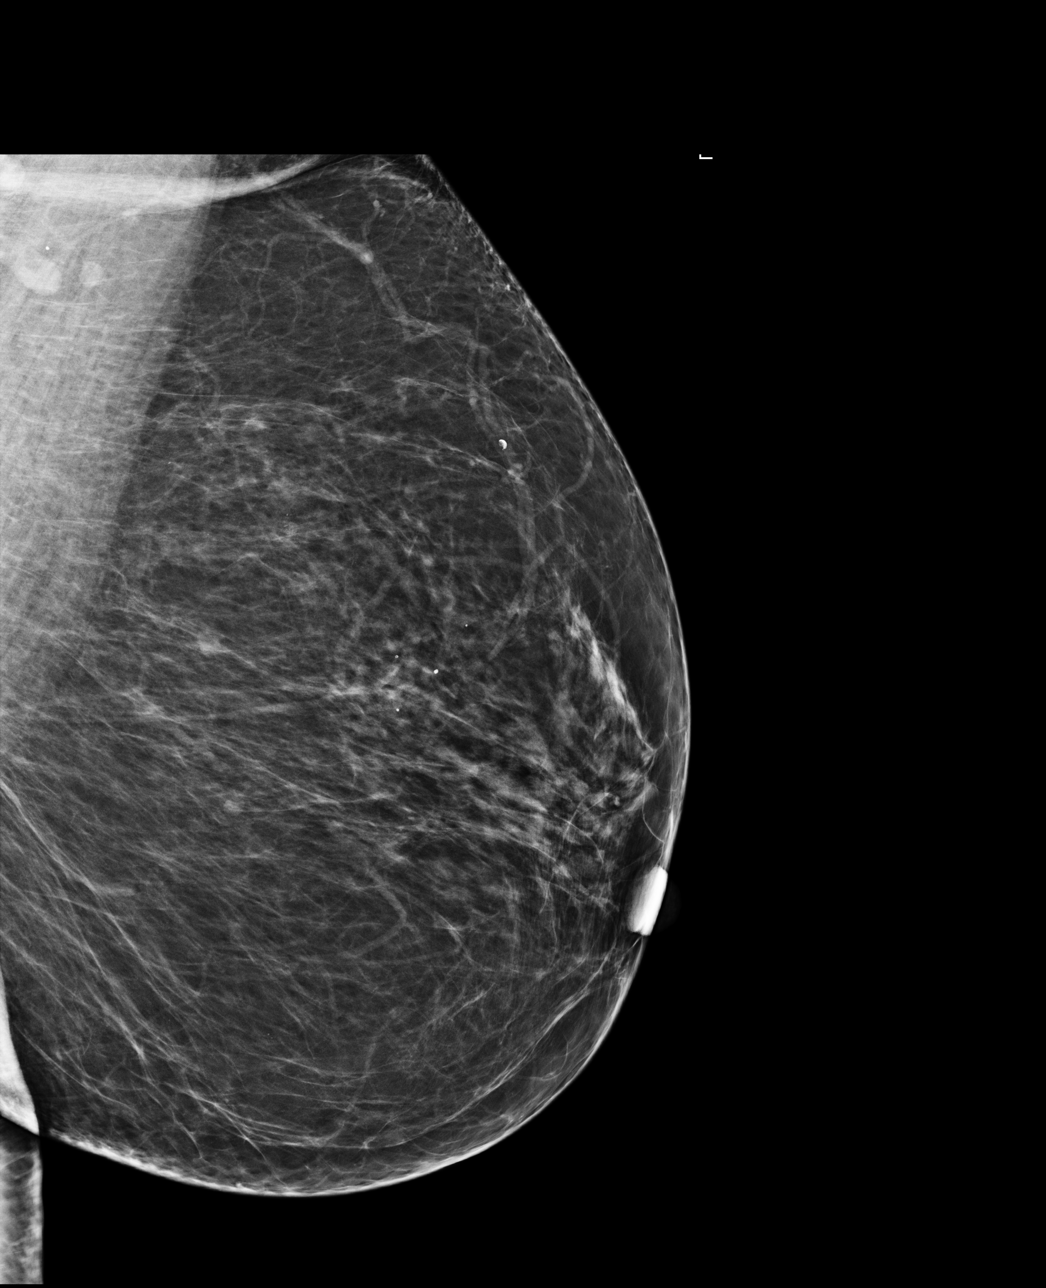

[L CC]
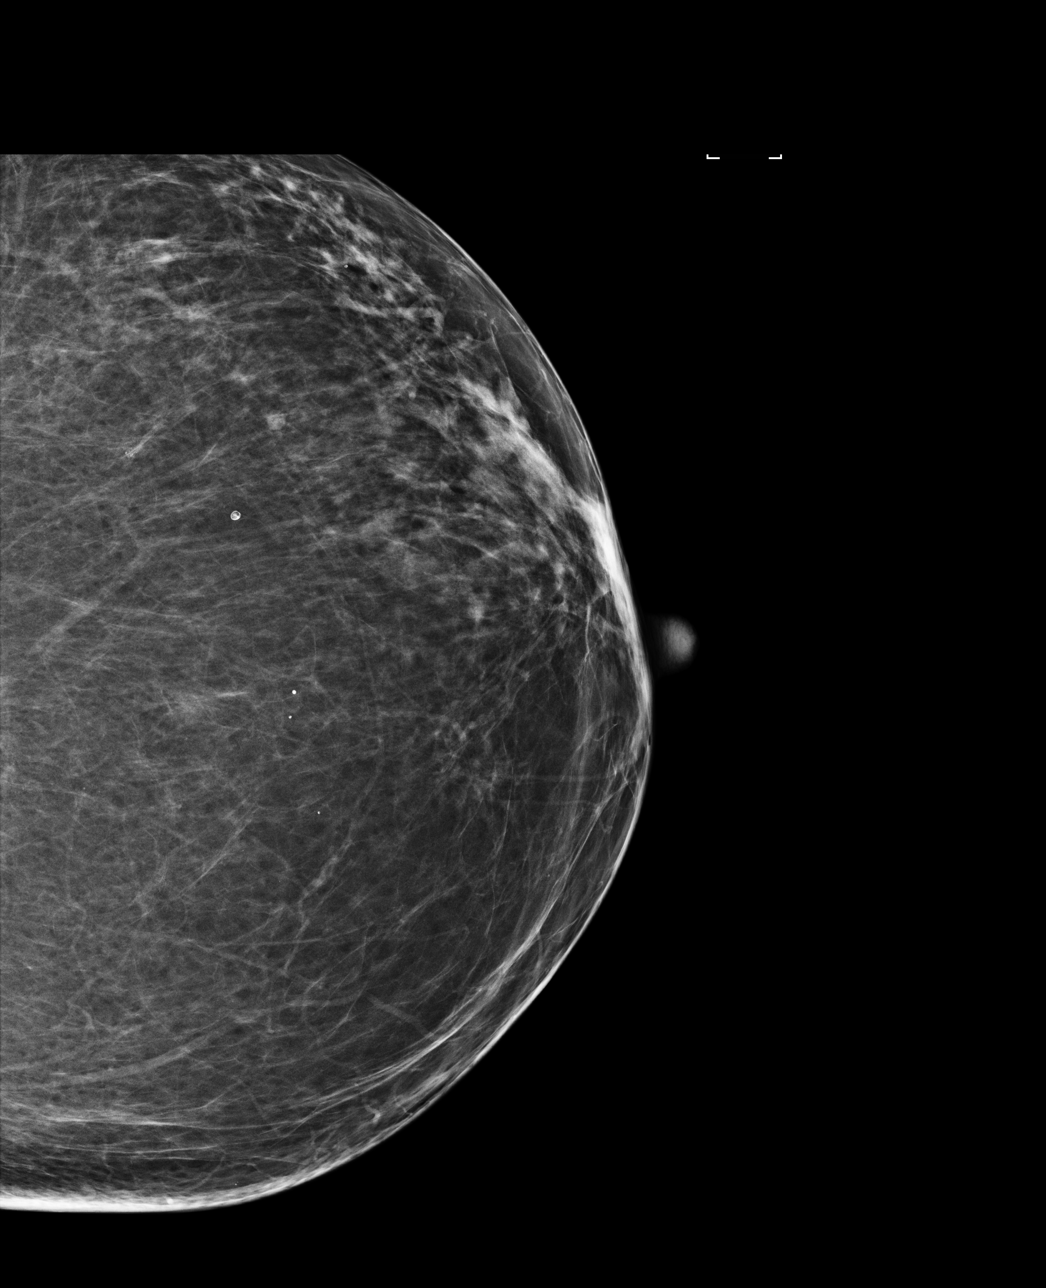

[R MLO]
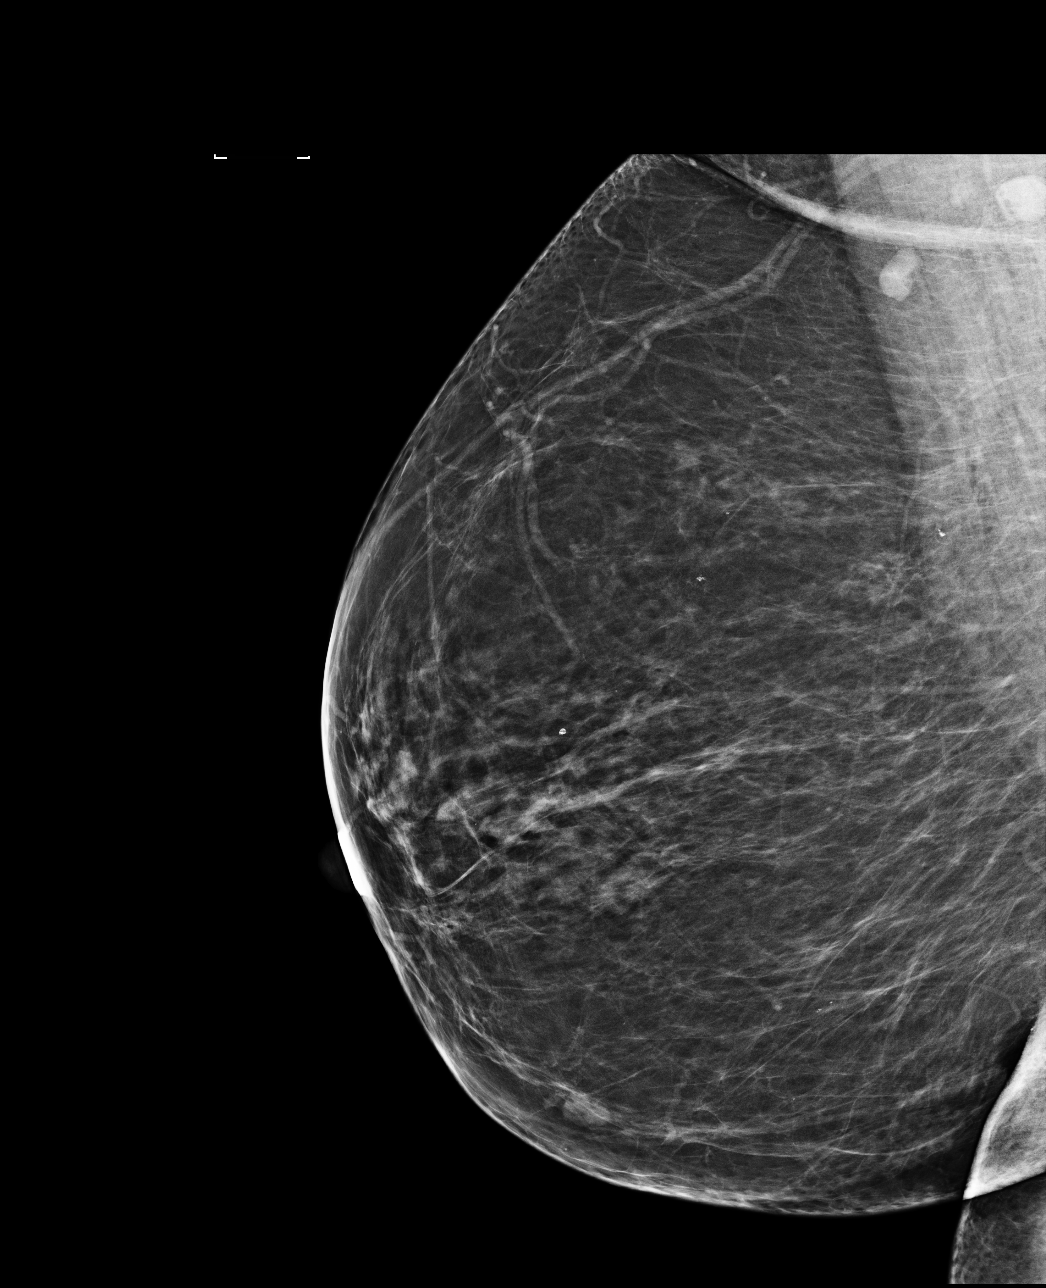

[R CC]
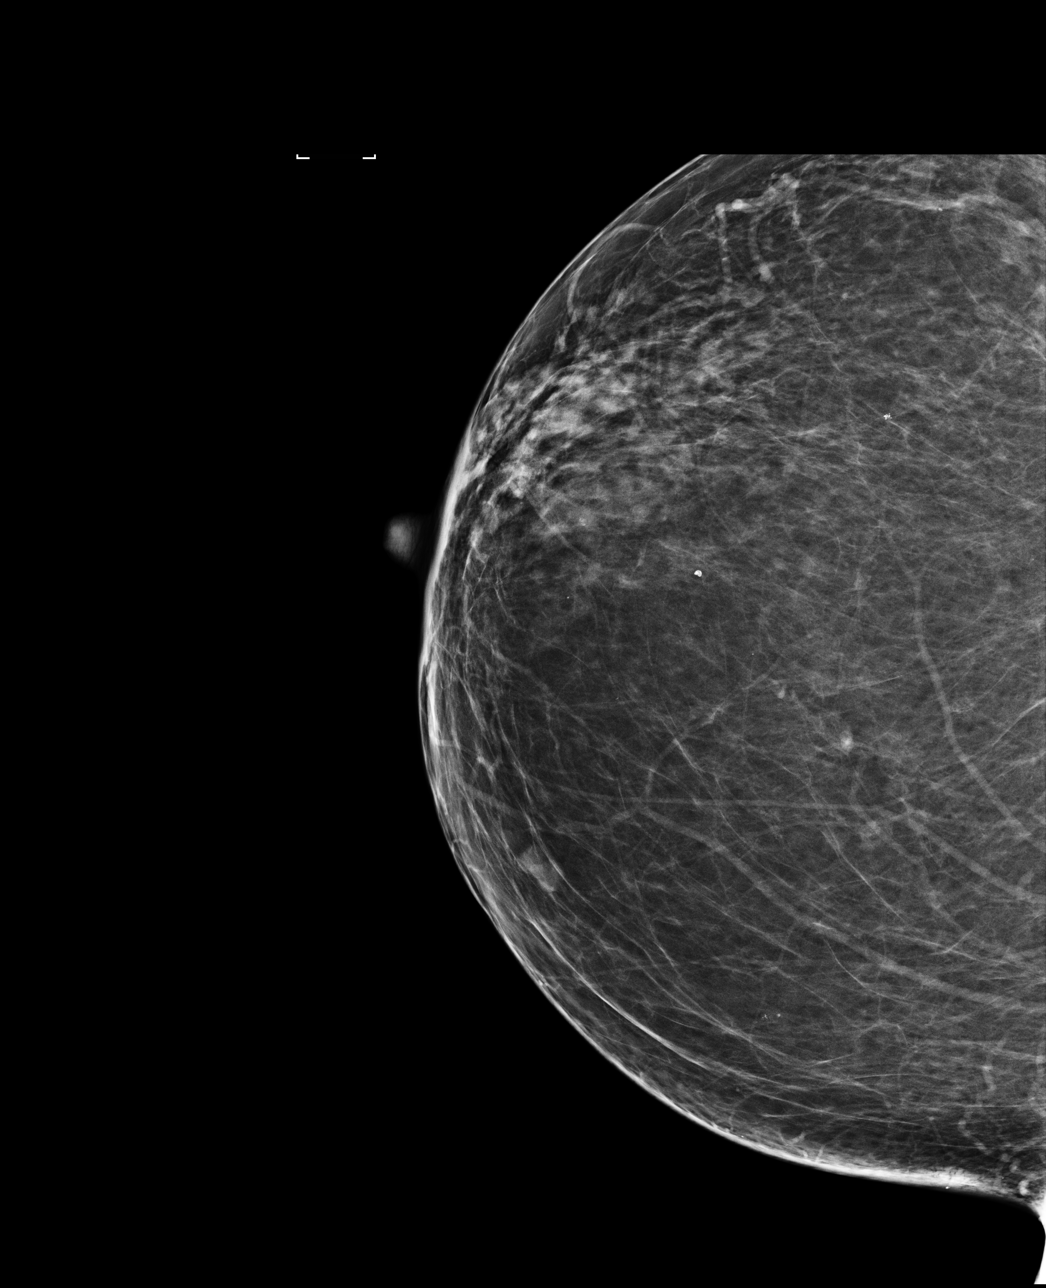

[4 of 4 positions shown; findings below may reference images not displayed]

ACR Breast Density Category b: There are scattered areas of
fibroglandular density.
FINDINGS: There are no findings suspicious for malignancy. Images were
processed with CAD.
IMPRESSION: No mammographic evidence of malignancy. A result letter of this
screening mammogram will be mailed directly to the patient.

RECOMMENDATION:
Screening mammogram in one year. (Code:AS-G-LCT)

BI-RADS CATEGORY  1: Negative.

## 2020-04-24 DIAGNOSIS — G4700 Insomnia, unspecified: Secondary | ICD-10-CM | POA: Diagnosis not present

## 2020-04-24 DIAGNOSIS — E039 Hypothyroidism, unspecified: Secondary | ICD-10-CM | POA: Diagnosis not present

## 2020-04-24 DIAGNOSIS — I119 Hypertensive heart disease without heart failure: Secondary | ICD-10-CM | POA: Diagnosis not present

## 2020-04-24 DIAGNOSIS — E7800 Pure hypercholesterolemia, unspecified: Secondary | ICD-10-CM | POA: Diagnosis not present

## 2020-04-24 DIAGNOSIS — E1165 Type 2 diabetes mellitus with hyperglycemia: Secondary | ICD-10-CM | POA: Diagnosis not present

## 2020-08-14 DIAGNOSIS — E7800 Pure hypercholesterolemia, unspecified: Secondary | ICD-10-CM | POA: Diagnosis not present

## 2020-08-14 DIAGNOSIS — Z79891 Long term (current) use of opiate analgesic: Secondary | ICD-10-CM | POA: Diagnosis not present

## 2020-08-14 DIAGNOSIS — G4700 Insomnia, unspecified: Secondary | ICD-10-CM | POA: Diagnosis not present

## 2020-08-14 DIAGNOSIS — E039 Hypothyroidism, unspecified: Secondary | ICD-10-CM | POA: Diagnosis not present

## 2020-08-14 DIAGNOSIS — E1165 Type 2 diabetes mellitus with hyperglycemia: Secondary | ICD-10-CM | POA: Diagnosis not present

## 2020-09-12 DIAGNOSIS — R921 Mammographic calcification found on diagnostic imaging of breast: Secondary | ICD-10-CM | POA: Diagnosis not present

## 2020-09-12 DIAGNOSIS — R928 Other abnormal and inconclusive findings on diagnostic imaging of breast: Secondary | ICD-10-CM | POA: Diagnosis not present

## 2021-01-01 DIAGNOSIS — E1165 Type 2 diabetes mellitus with hyperglycemia: Secondary | ICD-10-CM | POA: Diagnosis not present

## 2021-01-01 DIAGNOSIS — E039 Hypothyroidism, unspecified: Secondary | ICD-10-CM | POA: Diagnosis not present

## 2021-01-01 DIAGNOSIS — G4700 Insomnia, unspecified: Secondary | ICD-10-CM | POA: Diagnosis not present

## 2021-01-01 DIAGNOSIS — Z79899 Other long term (current) drug therapy: Secondary | ICD-10-CM | POA: Diagnosis not present

## 2021-01-01 DIAGNOSIS — I119 Hypertensive heart disease without heart failure: Secondary | ICD-10-CM | POA: Diagnosis not present

## 2021-01-01 DIAGNOSIS — E7800 Pure hypercholesterolemia, unspecified: Secondary | ICD-10-CM | POA: Diagnosis not present

## 2021-02-05 DIAGNOSIS — R921 Mammographic calcification found on diagnostic imaging of breast: Secondary | ICD-10-CM | POA: Diagnosis not present

## 2021-02-05 DIAGNOSIS — Z01411 Encounter for gynecological examination (general) (routine) with abnormal findings: Secondary | ICD-10-CM | POA: Diagnosis not present

## 2021-02-05 DIAGNOSIS — Z6829 Body mass index (BMI) 29.0-29.9, adult: Secondary | ICD-10-CM | POA: Diagnosis not present

## 2021-02-05 DIAGNOSIS — R928 Other abnormal and inconclusive findings on diagnostic imaging of breast: Secondary | ICD-10-CM | POA: Diagnosis not present

## 2021-06-06 DIAGNOSIS — E039 Hypothyroidism, unspecified: Secondary | ICD-10-CM | POA: Diagnosis not present

## 2021-06-06 DIAGNOSIS — G4700 Insomnia, unspecified: Secondary | ICD-10-CM | POA: Diagnosis not present

## 2021-06-06 DIAGNOSIS — E7800 Pure hypercholesterolemia, unspecified: Secondary | ICD-10-CM | POA: Diagnosis not present

## 2021-06-06 DIAGNOSIS — E1165 Type 2 diabetes mellitus with hyperglycemia: Secondary | ICD-10-CM | POA: Diagnosis not present

## 2021-06-06 DIAGNOSIS — Z79891 Long term (current) use of opiate analgesic: Secondary | ICD-10-CM | POA: Diagnosis not present

## 2021-08-06 DIAGNOSIS — R928 Other abnormal and inconclusive findings on diagnostic imaging of breast: Secondary | ICD-10-CM | POA: Diagnosis not present

## 2021-12-05 DIAGNOSIS — E1165 Type 2 diabetes mellitus with hyperglycemia: Secondary | ICD-10-CM | POA: Diagnosis not present

## 2021-12-05 DIAGNOSIS — E039 Hypothyroidism, unspecified: Secondary | ICD-10-CM | POA: Diagnosis not present

## 2021-12-05 DIAGNOSIS — I119 Hypertensive heart disease without heart failure: Secondary | ICD-10-CM | POA: Diagnosis not present

## 2021-12-05 DIAGNOSIS — G4700 Insomnia, unspecified: Secondary | ICD-10-CM | POA: Diagnosis not present

## 2021-12-05 DIAGNOSIS — E7800 Pure hypercholesterolemia, unspecified: Secondary | ICD-10-CM | POA: Diagnosis not present

## 2021-12-05 DIAGNOSIS — Z79899 Other long term (current) drug therapy: Secondary | ICD-10-CM | POA: Diagnosis not present

## 2022-02-05 DIAGNOSIS — Z1231 Encounter for screening mammogram for malignant neoplasm of breast: Secondary | ICD-10-CM | POA: Diagnosis not present

## 2022-02-05 DIAGNOSIS — I10 Essential (primary) hypertension: Secondary | ICD-10-CM | POA: Diagnosis not present

## 2022-02-05 DIAGNOSIS — Z01411 Encounter for gynecological examination (general) (routine) with abnormal findings: Secondary | ICD-10-CM | POA: Diagnosis not present

## 2022-03-17 DIAGNOSIS — L718 Other rosacea: Secondary | ICD-10-CM | POA: Diagnosis not present

## 2022-05-29 DIAGNOSIS — Z0001 Encounter for general adult medical examination with abnormal findings: Secondary | ICD-10-CM | POA: Diagnosis not present

## 2022-05-29 DIAGNOSIS — E7800 Pure hypercholesterolemia, unspecified: Secondary | ICD-10-CM | POA: Diagnosis not present

## 2022-05-29 DIAGNOSIS — E1165 Type 2 diabetes mellitus with hyperglycemia: Secondary | ICD-10-CM | POA: Diagnosis not present

## 2022-05-29 DIAGNOSIS — E039 Hypothyroidism, unspecified: Secondary | ICD-10-CM | POA: Diagnosis not present

## 2022-11-27 DIAGNOSIS — G4700 Insomnia, unspecified: Secondary | ICD-10-CM | POA: Diagnosis not present

## 2022-11-27 DIAGNOSIS — E7800 Pure hypercholesterolemia, unspecified: Secondary | ICD-10-CM | POA: Diagnosis not present

## 2022-11-27 DIAGNOSIS — E039 Hypothyroidism, unspecified: Secondary | ICD-10-CM | POA: Diagnosis not present

## 2022-11-27 DIAGNOSIS — Z23 Encounter for immunization: Secondary | ICD-10-CM | POA: Diagnosis not present

## 2022-11-27 DIAGNOSIS — E1165 Type 2 diabetes mellitus with hyperglycemia: Secondary | ICD-10-CM | POA: Diagnosis not present

## 2023-02-11 DIAGNOSIS — Z124 Encounter for screening for malignant neoplasm of cervix: Secondary | ICD-10-CM | POA: Diagnosis not present

## 2023-02-11 DIAGNOSIS — I10 Essential (primary) hypertension: Secondary | ICD-10-CM | POA: Diagnosis not present

## 2023-02-11 DIAGNOSIS — Z01411 Encounter for gynecological examination (general) (routine) with abnormal findings: Secondary | ICD-10-CM | POA: Diagnosis not present

## 2023-02-11 DIAGNOSIS — Z1231 Encounter for screening mammogram for malignant neoplasm of breast: Secondary | ICD-10-CM | POA: Diagnosis not present

## 2023-04-30 DIAGNOSIS — Z79891 Long term (current) use of opiate analgesic: Secondary | ICD-10-CM | POA: Diagnosis not present

## 2023-04-30 DIAGNOSIS — G4700 Insomnia, unspecified: Secondary | ICD-10-CM | POA: Diagnosis not present

## 2023-04-30 DIAGNOSIS — E1165 Type 2 diabetes mellitus with hyperglycemia: Secondary | ICD-10-CM | POA: Diagnosis not present

## 2023-04-30 DIAGNOSIS — E039 Hypothyroidism, unspecified: Secondary | ICD-10-CM | POA: Diagnosis not present

## 2023-04-30 DIAGNOSIS — E7800 Pure hypercholesterolemia, unspecified: Secondary | ICD-10-CM | POA: Diagnosis not present

## 2023-11-05 DIAGNOSIS — E039 Hypothyroidism, unspecified: Secondary | ICD-10-CM | POA: Diagnosis not present

## 2023-11-05 DIAGNOSIS — G4700 Insomnia, unspecified: Secondary | ICD-10-CM | POA: Diagnosis not present

## 2023-11-05 DIAGNOSIS — Z23 Encounter for immunization: Secondary | ICD-10-CM | POA: Diagnosis not present

## 2023-11-05 DIAGNOSIS — F1320 Sedative, hypnotic or anxiolytic dependence, uncomplicated: Secondary | ICD-10-CM | POA: Diagnosis not present

## 2023-11-05 DIAGNOSIS — E1165 Type 2 diabetes mellitus with hyperglycemia: Secondary | ICD-10-CM | POA: Diagnosis not present

## 2023-11-05 DIAGNOSIS — E7800 Pure hypercholesterolemia, unspecified: Secondary | ICD-10-CM | POA: Diagnosis not present

## 2024-02-16 DIAGNOSIS — Z13820 Encounter for screening for osteoporosis: Secondary | ICD-10-CM | POA: Diagnosis not present

## 2024-02-16 DIAGNOSIS — Z01419 Encounter for gynecological examination (general) (routine) without abnormal findings: Secondary | ICD-10-CM | POA: Diagnosis not present

## 2024-02-16 DIAGNOSIS — Z1231 Encounter for screening mammogram for malignant neoplasm of breast: Secondary | ICD-10-CM | POA: Diagnosis not present
